# Patient Record
Sex: Male | Born: 2000 | Race: White | Hispanic: No | Marital: Single | Smoking: Never smoker
Health system: Southern US, Community
[De-identification: ages and names within clinical notes are randomized; demographics above are authoritative.]

## PROBLEM LIST (undated history)

## (undated) HISTORY — PX: WRIST SURGERY: SHX841

## (undated) HISTORY — PX: BACK SURGERY: SHX140

## (undated) HISTORY — PX: PHARYNGEAL FLAP REVISION: SHX2234

---
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
## 2024-01-18 ENCOUNTER — Ambulatory Visit: Payer: Self-pay | Admitting: Urgent Care

## 2024-01-18 ENCOUNTER — Ambulatory Visit (INDEPENDENT_AMBULATORY_CARE_PROVIDER_SITE_OTHER)

## 2024-01-18 ENCOUNTER — Ambulatory Visit
Admission: RE | Admit: 2024-01-18 | Discharge: 2024-01-18 | Disposition: A | Discharge status: Home / Self Care | Payer: Self-pay | Source: Ambulatory Visit | Attending: Family Medicine | Admitting: Family Medicine

## 2024-01-18 VITALS — BP 140/96 | HR 100 | Temp 98.4°F | Resp 18

## 2024-01-18 DIAGNOSIS — J329 Chronic sinusitis, unspecified: Secondary | ICD-10-CM

## 2024-01-18 DIAGNOSIS — J040 Acute laryngitis: Secondary | ICD-10-CM

## 2024-01-18 DIAGNOSIS — J40 Bronchitis, not specified as acute or chronic: Secondary | ICD-10-CM

## 2024-01-18 MED ORDER — PREDNISONE 20 MG PO TABS: ORAL_TABLET | ORAL | 0 refills | Status: AC

## 2024-01-18 MED ORDER — BENZONATATE 100 MG PO CAPS: 100.0000 mg | ORAL_CAPSULE | Freq: Three times a day (TID) | ORAL | 0 refills | Status: AC | PRN

## 2024-01-18 MED ORDER — AZITHROMYCIN 250 MG PO TABS: ORAL_TABLET | ORAL | 0 refills | Status: AC

## 2024-01-18 NOTE — ED Provider Notes (Signed)
 [facility]  Note:  This document was prepared using Conservation officer, historic buildings and may include unintentional dictation errors.  MRN: [id number] DOB: August 28, 2000  Subjective:   Darrell Ray is a 23 y.o. male presenting for 4 day history of sinus congestion, sinus drainage, hoarseness, right ear fullness, productive cough. Has been losing his voice. 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  No sequelae.  No smoking of any kind including cigarettes, cigars, vaping, marijuana use.  No history of asthma.  Still vapes, is working on quitting.   No current facility-administered medications for this encounter. No current outpatient medications on file.   Allergies  Allergen Reactions   Dextromethorphan-Guaifenesin Other (See Comments)    CNS Disorder    No past medical history on file.   Past Surgical History:  Procedure Laterality Date   BACK SURGERY     PHARYNGEAL FLAP REVISION     WRIST SURGERY      No family history on file.  Social History   Tobacco Use   Smoking status: Never   Smokeless tobacco: Never  Substance Use Topics   Alcohol use: No    ROS   Objective:   Vitals: BP (!) 140/96 (BP Location: Right Arm)   Pulse 100   Temp 98.4 F (36.9 C) (Oral)   Resp 18   SpO2 98%   Physical Exam Constitutional:      General: He is not in acute distress.    Appearance: Normal appearance. He is well-developed and normal weight. He is not ill-appearing, toxic-appearing or diaphoretic.  HENT:     Head: Normocephalic and atraumatic.     Right Ear: Ear canal and external ear normal. No drainage, swelling or tenderness. No middle ear effusion. There is no impacted cerumen. Tympanic membrane is not erythematous or bulging.     Left Ear: Ear canal and external ear normal. No drainage, swelling or tenderness.  No middle ear effusion. There is no impacted cerumen. Tympanic membrane is not erythematous or bulging.     Ears:      Comments: Bilateral effusions without erythema, bulging.     Nose: Congestion present. No rhinorrhea.     Mouth/Throat:     Mouth: Mucous membranes are moist.     Pharynx: Posterior oropharyngeal erythema present. No pharyngeal swelling, oropharyngeal exudate or uvula swelling.     Tonsils: No tonsillar exudate or tonsillar abscesses. 0 on the right. 0 on the left.  Eyes:     General: No scleral icterus.       Right eye: No discharge.        Left eye: No discharge.     Extraocular Movements: Extraocular movements intact.     Conjunctiva/sclera: Conjunctivae normal.  Cardiovascular:     Rate and Rhythm: Normal rate and regular rhythm.     Heart sounds: Normal heart sounds. No murmur heard.    No friction rub. No gallop.  Pulmonary:     Effort: Pulmonary effort is normal. No respiratory distress.     Breath sounds: No stridor. Examination of the right-upper field reveals rhonchi. Examination of the right-middle field reveals rhonchi. Rhonchi present. No wheezing or rales.  Musculoskeletal:     Cervical back: Normal range of motion and neck supple. No rigidity. No muscular tenderness.  Neurological:     General: No focal deficit present.     Mental Status: He is alert and oriented to person, place, and time.  Psychiatric:  Mood and Affect: Mood normal.        Behavior: Behavior normal.        Thought Content: Thought content normal.        Judgment: Judgment normal.     DG Chest 2 View Result Date: 01/18/2024 CLINICAL DATA:  Productive cough EXAM: CHEST - 2 VIEW COMPARISON:  Chest radiograph dated 09/11/2016 FINDINGS: Normal lung volumes. Left basilar linear opacity. No pleural effusion or pneumothorax. The heart size and mediastinal contours are within normal limits. No acute osseous abnormality. Thoracolumbar spinal hardware appears intact. IMPRESSION: Left basilar linear opacity, likely atelectasis. Electronically Signed   By: Limin  Xu M.D.   On: 01/18/2024 09:55      Assessment and Plan :   PDMP not reviewed this encounter.  1. Sinobronchitis   2. Laryngitis    Recommended managing for sinobronchitis, laryngitis with oral prednisone  and azithromycin .  Use supportive care otherwise.  Counseled patient on potential for adverse effects with medications prescribed/recommended today, ER and return-to-clinic precautions discussed, patient verbalized understanding.    [name], PA-C 01/18/24 1011

## 2024-01-18 NOTE — Discharge Instructions (Signed)
 I am managing you for sinobronchitis, laryngitis with prednisone  and azithromycin . Use cough capsules as needed. Tylenol can help with fevers, aches, pains. Zyrtec should be used for sinus drainage and fluid in the inner ear tubes.

## 2024-01-18 NOTE — ED Triage Notes (Signed)
 Pt c/o prod cough, head/chest congestion, sore throat, hoarse-sx started 7/6-NAD-steady gait

## 2024-03-04 ENCOUNTER — Other Ambulatory Visit: Payer: Self-pay | Admitting: Family

## 2024-03-04 DIAGNOSIS — N4889 Other specified disorders of penis: Secondary | ICD-10-CM

## 2024-03-07 ENCOUNTER — Ambulatory Visit
Admission: RE | Admit: 2024-03-07 | Discharge: 2024-03-07 | Disposition: A | Discharge status: Home / Self Care | Payer: Self-pay | Source: Ambulatory Visit | Attending: Family | Admitting: Family

## 2024-03-07 DIAGNOSIS — N4889 Other specified disorders of penis: Secondary | ICD-10-CM | POA: Insufficient documentation

## 2024-05-15 ENCOUNTER — Emergency Department (HOSPITAL_BASED_OUTPATIENT_CLINIC_OR_DEPARTMENT_OTHER)

## 2024-05-15 ENCOUNTER — Emergency Department (HOSPITAL_BASED_OUTPATIENT_CLINIC_OR_DEPARTMENT_OTHER): Admission: EM | Admit: 2024-05-15 | Discharge: 2024-05-15 | Disposition: A | Discharge status: Home / Self Care

## 2024-05-15 ENCOUNTER — Other Ambulatory Visit: Payer: Self-pay

## 2024-05-15 DIAGNOSIS — G44319 Acute post-traumatic headache, not intractable: Secondary | ICD-10-CM | POA: Diagnosis not present

## 2024-05-15 DIAGNOSIS — R519 Headache, unspecified: Secondary | ICD-10-CM | POA: Diagnosis present

## 2024-05-15 MED ORDER — ACETAMINOPHEN 500 MG PO TABS
1000.0000 mg | ORAL_TABLET | Freq: Once | ORAL | Status: AC
  Administered 2024-05-15: 1000 mg via ORAL
  Filled 2024-05-15: qty 2

## 2024-05-15 NOTE — Discharge Instructions (Addendum)
 You were seen in the emerged ferment today for evaluation after you hit your head.  Your imaging does not show any acute abnormality.  You might have some postconcussive symptoms.  I have included some information into the discharge paperwork for you to review.  For pain, recommend taking at 1000 mg of Tylenol and/or 600 mg of ibuprofen every 6 hours as needed for pain.  For the next 24 hours, I recommend sitting in a dark room without any screens such as phones, computers, or TVs.  This is important when it comes to helping your brain after possible concussion.  Please make sure that you are staying hydrated drinking plenty of fluids, mainly water.  I have included information for the [place] concussion clinic for you to call to schedule an appointment with if needed.  If you have any concerns, new or worsening symptoms, please return to your nearest emergency department for reevaluation.  Contact a doctor if: These symptoms do not go away: Headaches. Dizziness. Double vision or vision changes. Trouble sleeping. Changes in mood. You have new symptoms. Get help right away if: You have sudden: Headache that is very bad. Vomiting that does not stop. Changes in the size of one of your pupils. Pupils are the black centers of your eyes. Changes in how you see (vision). More confusion or more grumpy moods. You have a seizure. Your symptoms get worse. You have a clear or bloody fluid coming from your nose or ears. These symptoms may be an emergency. Get help right away. Call 911. Do not wait to see if the symptoms will go away. Do not drive yourself to the hospital.

## 2024-05-15 NOTE — ED Notes (Signed)
 DC paperwork given and verbally understood.

## 2024-05-15 NOTE — ED Triage Notes (Signed)
 Pt caox4 ambulatory NAD c/o headache and dizziness since hitting his head approx 2 hrs ago at work, denies LOC. Took Ibuprofen shortly after hitting head.

## 2024-05-15 NOTE — ED Provider Notes (Signed)
 [place] EMERGENCY DEPARTMENT AT [facility] Provider Note   CSN: [id number] Arrival date & time: 05/15/24  1448     Patient presents with: No chief complaint on file.   Darrell Ray is a 23 y.o. male self reportedly otherwise healthy presents to the emerged from today for evaluation after head injury.  Patient reports that he was at work and there was a piece of wood hanging from a wire that he ran into hitting his left eyebrow.  Reports that he felt a little lightheaded for around 30 minutes afterwards however this is improved.  He denies any eye pain or vision changes.  Denies any nausea, vomiting, or loss of consciousness.  No blood thinner use.  He reports his headache is at a 1 out of 10 now.  He reports he did have some left eyebrow pain which he would slide some frozen peas to and is feeling better.  He did take some ibuprofen this shortly prior to arrival.  He is mom wanted him to come into the emergency room to be evaluated.  He denies any trouble walking or talking.  Reports he is allergic to cough syrup.  Does vape.  Denies any EtOH or illicit drug use.  HPI     Prior to Admission medications   Medication Sig Start Date End Date Taking? Authorizing Provider  azithromycin  (ZITHROMAX ) 250 MG tablet Day 1: take 2 tablets. Day 2-5: Take 1 tablet daily. 01/18/24   [name], PA-C  benzonatate  (TESSALON ) 100 MG capsule Take 1 capsule (100 mg total) by mouth 3 (three) times daily as needed for cough. 01/18/24   [name], PA-C  predniSONE  (DELTASONE ) 20 MG tablet Take 2 tablets daily with breakfast. 01/18/24   [name], PA-C    Allergies: Dextromethorphan-guaifenesin    Review of Systems  Constitutional:  Negative for chills and fever.  Eyes:  Negative for photophobia and visual disturbance.  Gastrointestinal:  Negative for abdominal pain, constipation, diarrhea, nausea and vomiting.  Musculoskeletal:  Negative for back pain and neck pain.  Neurological:  Positive for  light-headedness and headaches. Negative for syncope, speech difficulty and weakness.    Updated Vital Signs BP (!) 132/91 (BP Location: Right Arm)   Pulse 72   Temp 98.1 F (36.7 C) (Oral)   Resp 14   SpO2 93%   Physical Exam Vitals and nursing note reviewed.  Constitutional:      General: He is not in acute distress.    Appearance: He is not ill-appearing or toxic-appearing.     Comments: Watching videos on his phone in no acute distress  HENT:     Mouth/Throat:     Mouth: Mucous membranes are moist.  Eyes:     General: No scleral icterus.    Extraocular Movements: Extraocular movements intact.     Pupils: Pupils are equal, round, and reactive to light.      Comments: Mild tenderness to the marked area with some swelling, but no bruising or overlying skin change noted. Skin is intact.   Pulmonary:     Effort: Pulmonary effort is normal. No respiratory distress.  Skin:    General: Skin is warm and dry.  Neurological:     General: No focal deficit present.     Mental Status: He is alert.     GCS: GCS eye subscore is 4. GCS verbal subscore is 5. GCS motor subscore is 6.     Cranial Nerves: No cranial nerve deficit, dysarthria or facial asymmetry.  Sensory: No sensory deficit.     Motor: No weakness or pronator drift.     Coordination: Finger-Nose-Finger Test normal.     Comments: Patient answering questions appropriately appropriate speech.  Cranial nerves are intact.  No facial droop.  Sensation reportedly intact symmetric throughout per patient.  Strength is 5 out of 5 patient's upper and lower bilateral extremities.  No pronator drift.  Normal finger-nose-finger.     (all labs ordered are listed, but only abnormal results are displayed) Labs Reviewed - No data to display  EKG: None  Radiology: CT Head Wo Contrast Result Date: 05/15/2024 EXAM: CT HEAD WITHOUT CONTRAST 05/15/2024 03:42:59 PM TECHNIQUE: CT of the head was performed without the administration of  intravenous contrast. Automated exposure control, iterative reconstruction, and/or weight based adjustment of the mA/kV was utilized to reduce the radiation dose to as low as reasonably achievable. COMPARISON: CT head 10/17/2007. CLINICAL HISTORY: Head trauma, abnormal mental status. Headache and dizziness since hitting head 2 hours ago. FINDINGS: BRAIN AND VENTRICLES: There is severe motion artifact through the skull base which severely limits assessment of the posterior fossa and inferior frontal and temporal lobes. Within this limitation, no acute infarct, intracranial hemorrhage, mass, midline shift, hydrocephalus, or extra-axial fluid collection is identified. Cerebral volume is normal. ORBITS: Nondiagnostic assessment of the orbits due to motion. SINUSES: The included paranasal sinuses and mastoid air cells are grossly clear. SOFT TISSUES AND SKULL: Limited assessment of the skull base. No evidence of an acute skull fracture more superiorly. IMPRESSION: 1. Severe motion artifact through the skull base. No evidence of an acute intracranial abnormality within this limitation. Electronically signed by: Dasie Hamburg MD 05/15/2024 03:50 PM EST RP Workstation: [id number]    Procedures   Medications Ordered in the ED  acetaminophen (TYLENOL) tablet 1,000 mg (has no administration in time range)    Clinical Course as of 05/15/24 1611  Wed May 15, 2024  1611 Patient reports headache is at 1/10 [RR]    Clinical Course User Index [RR] Bernis Ernst, PA-C   Medical Decision Making Amount and/or Complexity of Data Reviewed Radiology: ordered.  Risk OTC drugs.   23 y.o. male presents to the ER for evaluation of Headache sp head injury. Differential diagnosis includes but is not limited to trauma, bleed, concussion, fracture. Vital signs mildly elevated BP otherwise unremarkable. Physical exam as noted above.   CT was ordered in triage.  CT Head shows 1. Severe motion artifact through the skull  base. No evidence of an acute intracranial abnormality within this limitation. Per radiologist's interpretation.    On evaluation the room, patient is watching videos on his phone with the lights on.  Does not appear in any acute distress.  Reports his headache is at a 1 out of 10.  Will give him some Tylenol while here as he took ibuprofen shortly prior to arrival.  He has a benign neurological examination.  He is answer questions appropriate appropriate speech.  He does have some very mild swelling noted to the medial eyebrow but there is no raccoon eyes or battle signs.  No step-off or deformity.  He has no eye pain.  PERRLA.  EOMI.  He might have some posttraumatic headache however he does not appear in any acute distress now.  I discussed the imaging results with my attending given the limitations however patient does not have any pain to the base of his skull.  He has again benign neurological examination.  Do not think he needs to  be rescanned.  Recommend brain rest for the next 24 hours with rotation of Tylenol or Profen and hydration.  We discussed return precautions and red flag symptoms.  Patient is agreeable to plan and is amenable to discharge.  Stable for discharge home.  We discussed the results of the labs/imaging. The plan is brain rest, supportive care. We discussed strict return precautions and red flag symptoms. The patient verbalized their understanding and agrees to the plan. The patient is stable and being discharged home in good condition.  Portions of this report may have been transcribed using voice recognition software. Every effort was made to ensure accuracy; however, inadvertent computerized transcription errors may be present.    Final diagnoses:  Acute post-traumatic headache, not intractable    ED Discharge Orders     None          Bernis Ernst, [place] 05/15/24 1624    Ula Prentice SAUNDERS, MD 05/15/24 2328
